# Patient Record
Sex: Male | Born: 1945 | Race: White | Hispanic: No | Marital: Married
Health system: Southern US, Community
[De-identification: ages and names within clinical notes are randomized; demographics above are authoritative.]

---

## 2000-03-16 ENCOUNTER — Ambulatory Visit (HOSPITAL_COMMUNITY): Admission: RE | Admit: 2000-03-16 | Discharge: 2000-03-16 | Payer: Self-pay | Admitting: Gastroenterology

## 2000-03-30 ENCOUNTER — Encounter: Payer: Self-pay | Admitting: *Deleted

## 2000-04-04 ENCOUNTER — Ambulatory Visit (HOSPITAL_COMMUNITY): Admission: RE | Admit: 2000-04-04 | Discharge: 2000-04-04 | Payer: Self-pay | Admitting: *Deleted

## 2000-05-22 ENCOUNTER — Encounter: Payer: Self-pay | Admitting: Oncology

## 2000-05-22 ENCOUNTER — Ambulatory Visit (HOSPITAL_COMMUNITY): Admission: RE | Admit: 2000-05-22 | Discharge: 2000-05-22 | Payer: Self-pay | Admitting: Oncology

## 2000-05-30 ENCOUNTER — Encounter: Admission: RE | Admit: 2000-05-30 | Discharge: 2000-08-28 | Payer: Self-pay | Admitting: Oncology

## 2000-09-20 ENCOUNTER — Ambulatory Visit (HOSPITAL_COMMUNITY): Admission: RE | Admit: 2000-09-20 | Discharge: 2000-09-20 | Payer: Self-pay | Admitting: Oncology

## 2000-09-20 ENCOUNTER — Encounter: Payer: Self-pay | Admitting: Oncology

## 2000-11-20 ENCOUNTER — Ambulatory Visit (HOSPITAL_COMMUNITY): Admission: RE | Admit: 2000-11-20 | Discharge: 2000-11-20 | Payer: Self-pay | Admitting: Gastroenterology

## 2002-07-22 ENCOUNTER — Encounter: Admission: RE | Admit: 2002-07-22 | Discharge: 2002-07-22 | Payer: Self-pay | Admitting: Internal Medicine

## 2002-07-22 ENCOUNTER — Encounter: Payer: Self-pay | Admitting: Internal Medicine

## 2002-08-11 ENCOUNTER — Encounter: Payer: Self-pay | Admitting: Internal Medicine

## 2002-08-11 ENCOUNTER — Ambulatory Visit (HOSPITAL_COMMUNITY): Admission: RE | Admit: 2002-08-11 | Discharge: 2002-08-11 | Payer: Self-pay | Admitting: Internal Medicine

## 2002-09-03 ENCOUNTER — Ambulatory Visit (HOSPITAL_COMMUNITY): Admission: RE | Admit: 2002-09-03 | Discharge: 2002-09-03 | Payer: Self-pay | Admitting: Internal Medicine

## 2002-09-04 ENCOUNTER — Ambulatory Visit (HOSPITAL_COMMUNITY): Admission: RE | Admit: 2002-09-04 | Discharge: 2002-09-04 | Payer: Self-pay | Admitting: Internal Medicine

## 2002-09-04 ENCOUNTER — Encounter: Payer: Self-pay | Admitting: Internal Medicine

## 2002-09-23 ENCOUNTER — Ambulatory Visit (HOSPITAL_COMMUNITY): Admission: RE | Admit: 2002-09-23 | Discharge: 2002-09-23 | Payer: Self-pay | Admitting: Oncology

## 2002-09-23 ENCOUNTER — Encounter: Payer: Self-pay | Admitting: Oncology

## 2002-09-25 ENCOUNTER — Ambulatory Visit (HOSPITAL_COMMUNITY): Admission: RE | Admit: 2002-09-25 | Discharge: 2002-09-25 | Payer: Self-pay | Admitting: Gastroenterology

## 2003-05-27 ENCOUNTER — Ambulatory Visit (HOSPITAL_COMMUNITY): Admission: RE | Admit: 2003-05-27 | Discharge: 2003-05-27 | Payer: Self-pay | Admitting: Internal Medicine

## 2003-06-05 ENCOUNTER — Ambulatory Visit (HOSPITAL_COMMUNITY): Admission: RE | Admit: 2003-06-05 | Discharge: 2003-06-05 | Payer: Self-pay | Admitting: Internal Medicine

## 2004-10-10 IMAGING — US US PARACENTESIS
1 series · 8 of 8 positions shown · non-contrast
Comparison: none

CLINICAL DATA: Liver cirrhosis and liver cancer.  
 ULTRASOUND-GUIDED PARACENTESIS

[Series 1: unknown · 0.41mm/px · 8 of 8 slices shown]
[im 1/8]
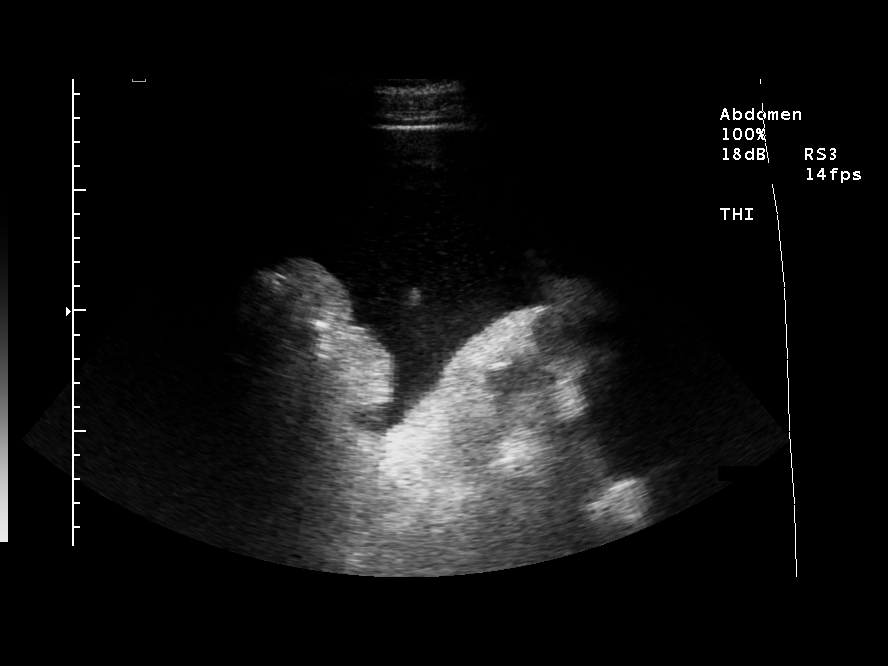
[im 2/8]
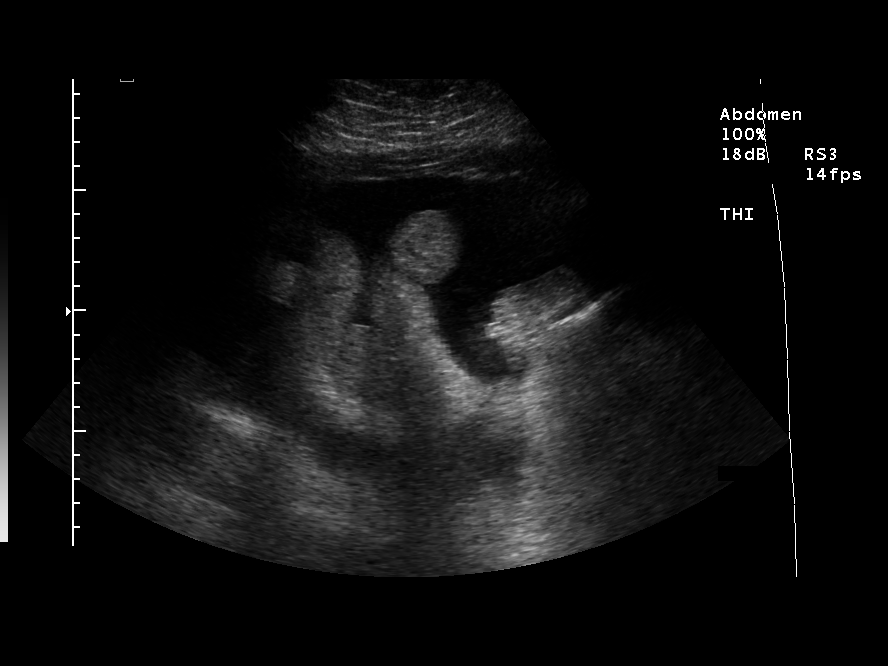
[im 3/8]
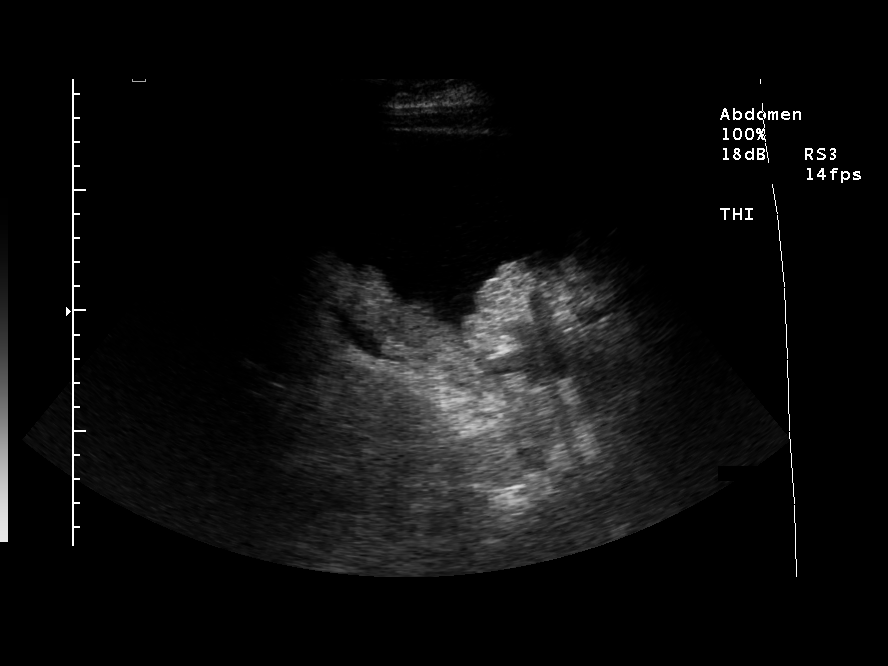
[im 4/8]
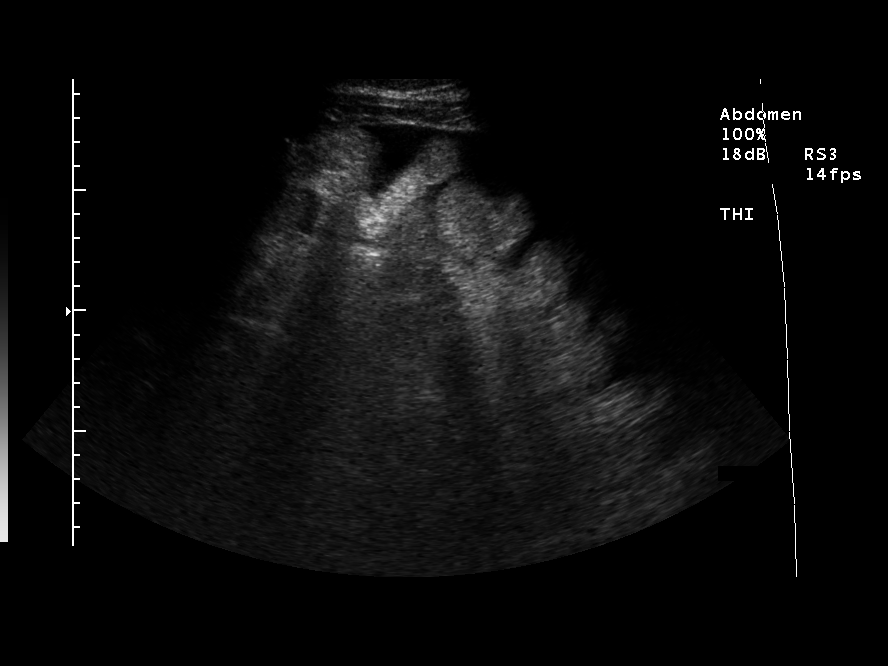
[im 5/8]
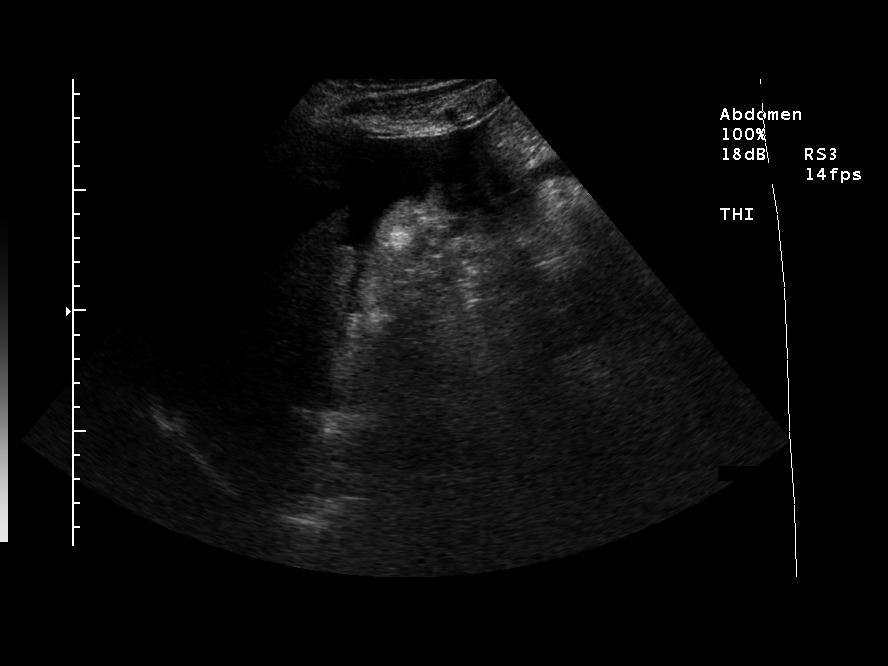
[im 6/8]
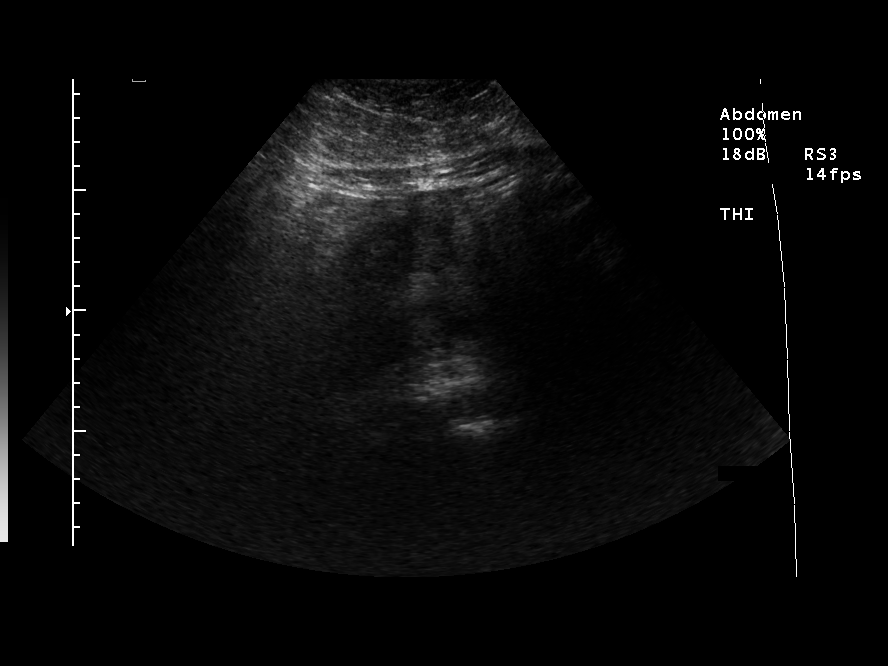
[im 7/8]
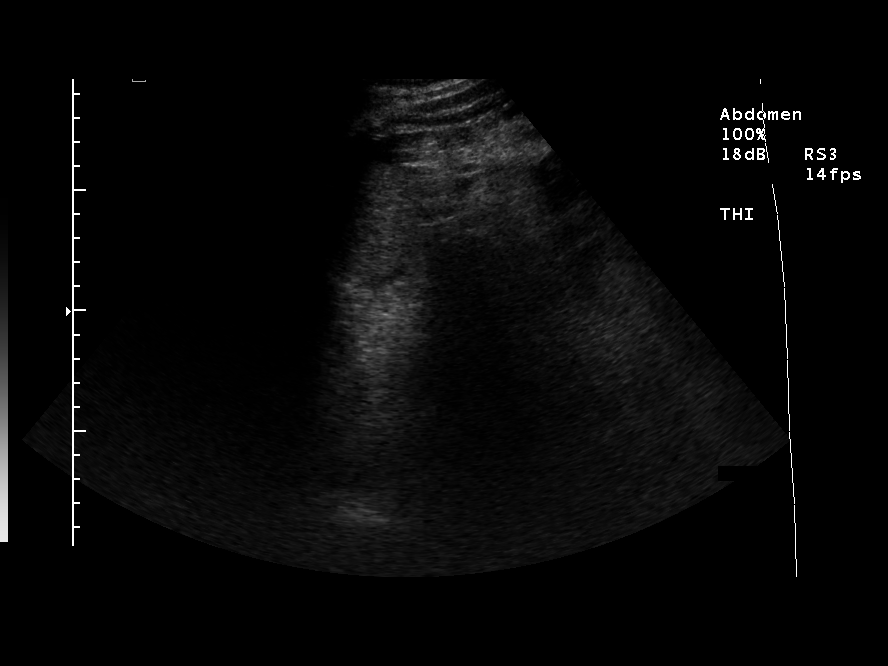
[im 8/8]
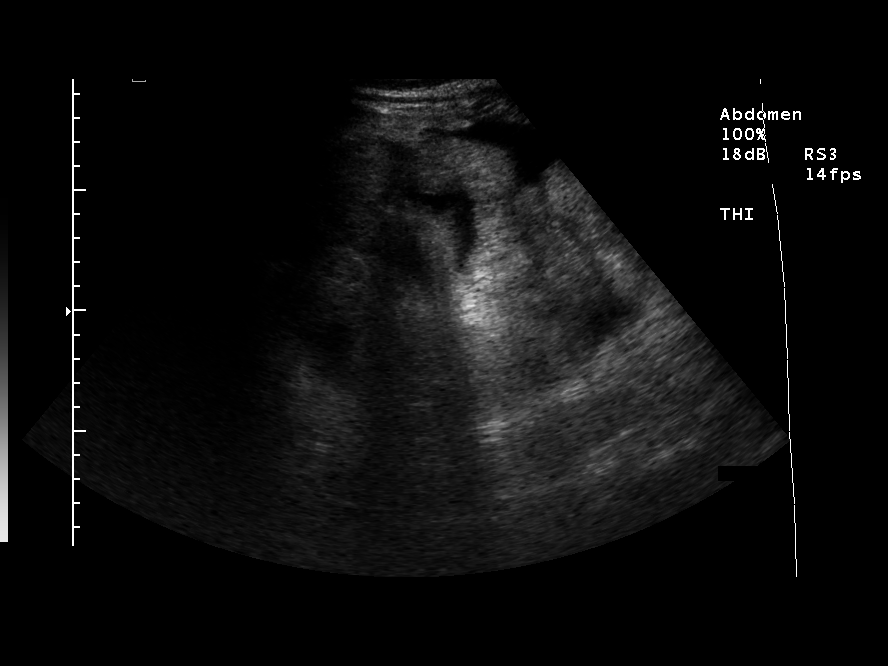

[8 of 8 positions shown; findings below may reference images not displayed]

FINDINGS: An ultrasound-guided paracentesis was thoroughly discussed with the patient and questions answered.  The benefits, risks, alternatives, and complications were also discussed.   The patient understands and wishes to proceed with the procedure.  A verbal, as well as written consent, was obtained.
 Ultrasound was performed to localize and mark an adequate pocket of fluid in the right lower quadrant of the abdomen was accessed.  The area was then prepped and draped in the normal sterile fashion.  1% lidocaine was used for local anesthesia.  Under ultrasound guidance, a 19-gauge [place] Tiger needle was introduced yielding approximately 7 liters of cloudy amber fluid.  The patient tolerated the procedure well and there were no immediate complications. 
 IMPRESSION
 Successful ultrasound-guided paracentesis.

## 2004-10-19 IMAGING — US US PARACENTESIS
1 series · 10 of 10 positions shown · non-contrast
Comparison: none

CLINICAL DATA: Cirrhosis and cancer.  Reason for exam:  Abdominal ascites.
 ULTRASOUND GUIDED PARACENTESIS

[Series 1: unknown · 0.33mm/px · 10 of 10 slices shown]
[im 1/10]
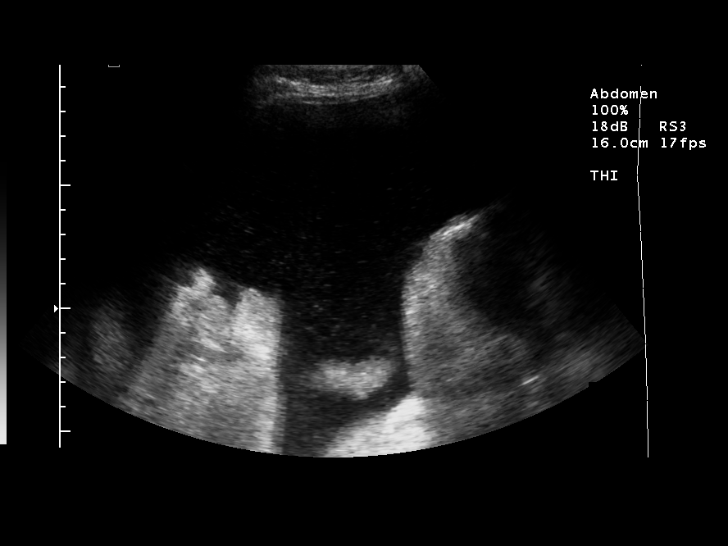
[im 2/10]
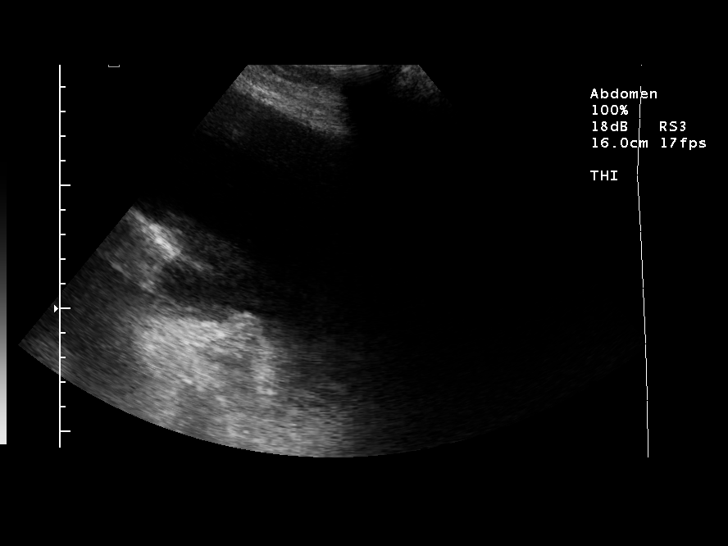
[im 3/10]
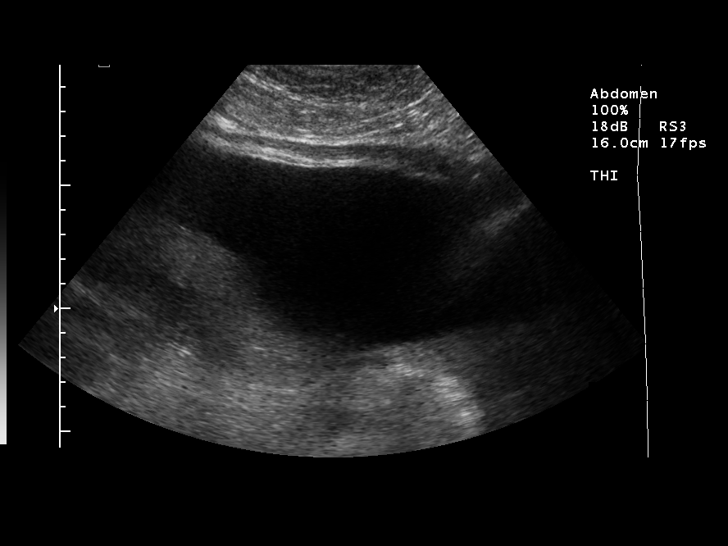
[im 4/10]
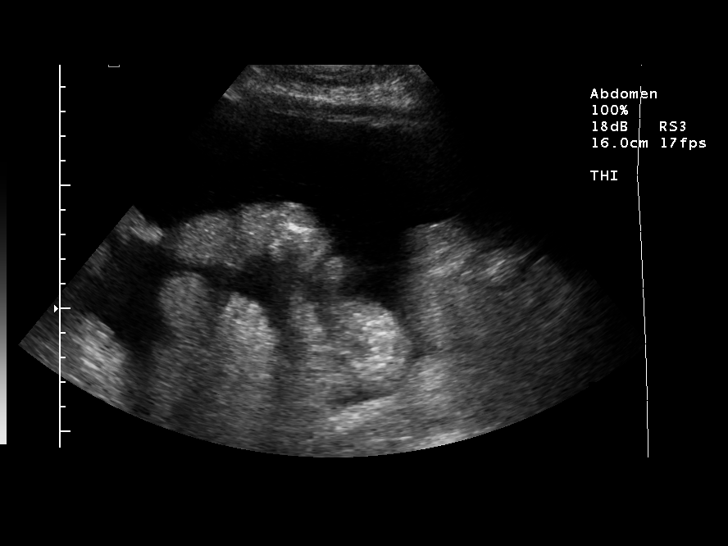
[im 5/10]
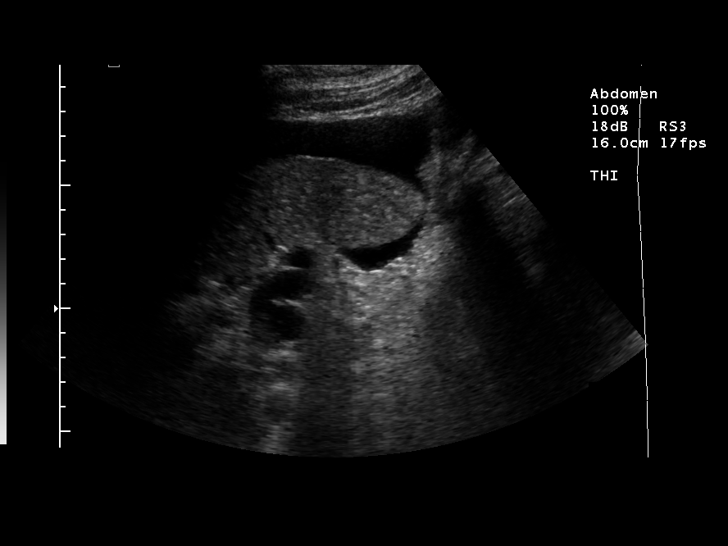
[im 6/10]
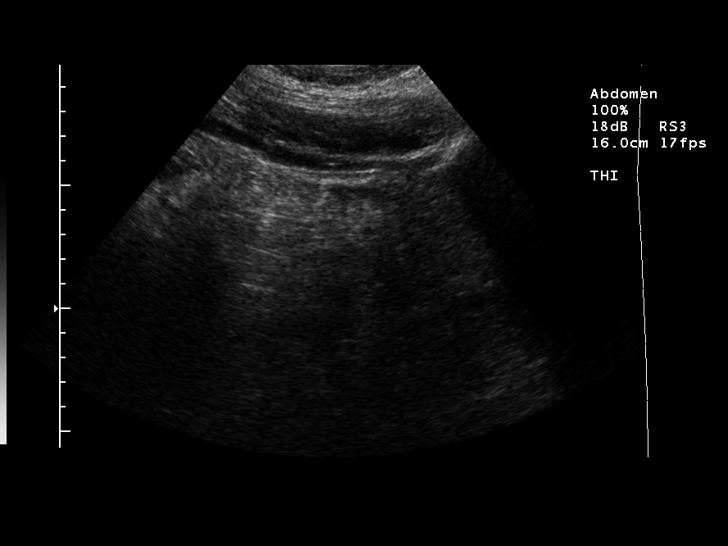
[im 7/10]
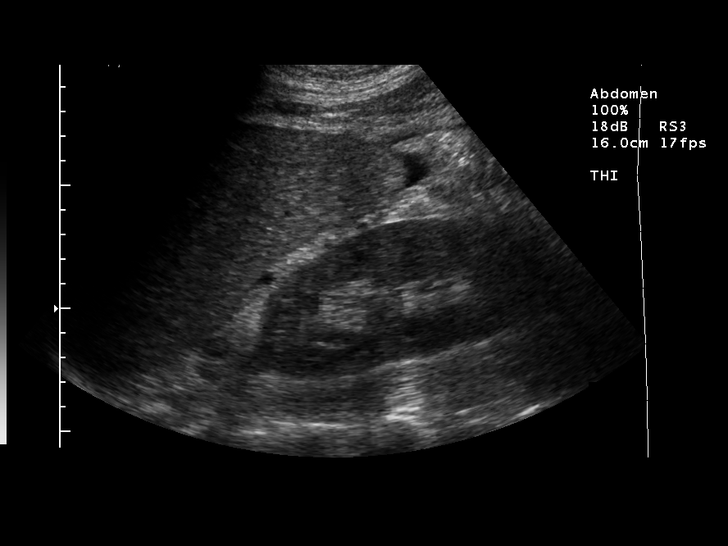
[im 8/10]
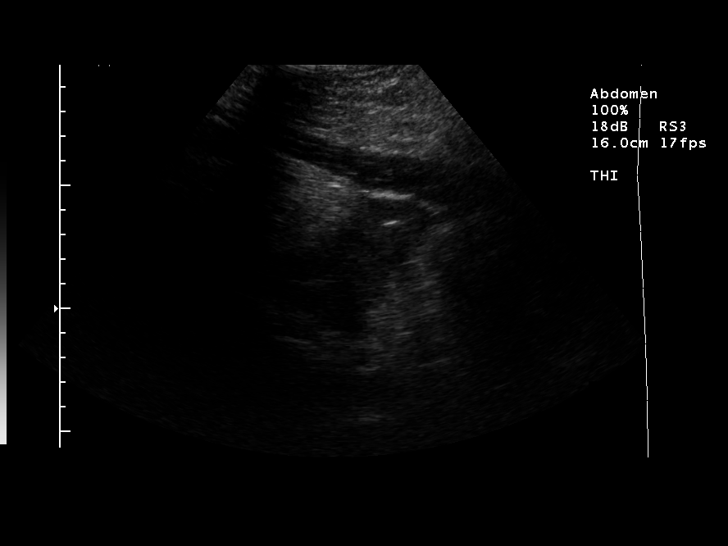
[im 9/10]
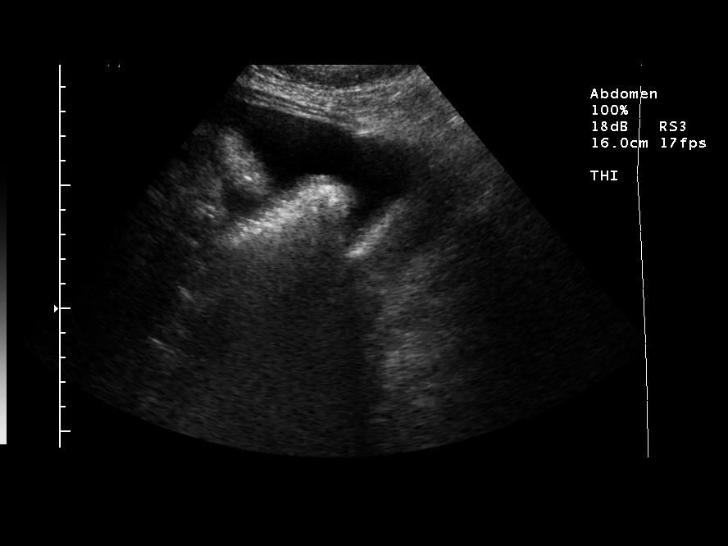
[im 10/10]
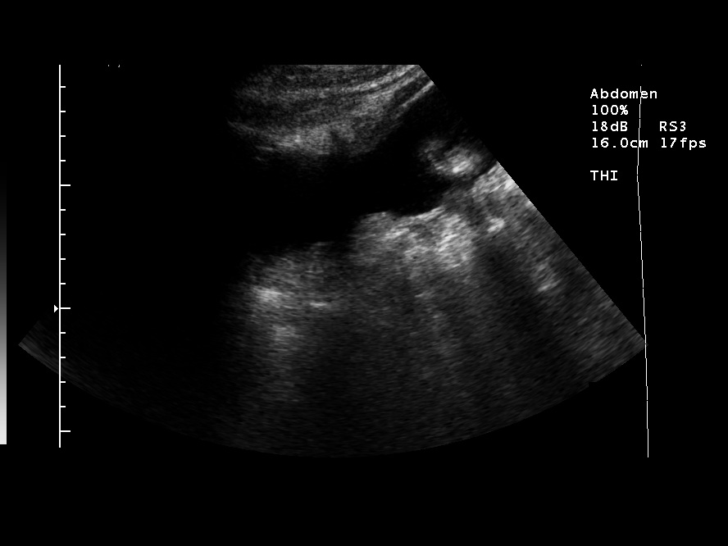

[10 of 10 positions shown; findings below may reference images not displayed]

FINDINGS: An ultrasound-guided paracentesis was thoroughly discussed with the patient and questions answered.  The benefits, risks, alternatives, and complications were also discussed.  The patient understands and wishes to proceed with the procedure.  A verbal, as well as written consent, was obtained.
 Ultrasound was performed to localize and mark an adequate pocket of fluid in the right lower quadrant of the abdomen.  The area was then prepped and draped in the normal sterile fashion.  1% Lidocaine was used for local anesthesia. A 19 gauge Yueh catheter was introduced yielding approximately 10 liters of yellow fluid.  The patient tolerated the procedure well and there were no immediate complications.  
 IMPRESSION
 Successful ultrasound guided paracentesis yielding 10 liters of yellow fluid.

## 2013-07-23 ENCOUNTER — Telehealth: Payer: Self-pay

## 2013-07-23 NOTE — Telephone Encounter (Signed)
Error
